# Patient Record
Sex: Female | Born: 1960 | Race: White | Hispanic: No | Marital: Single | Smoking: Never smoker
Health system: Southern US, Community
[De-identification: ages and names within clinical notes are randomized; demographics above are authoritative.]

## PROBLEM LIST (undated history)

## (undated) DIAGNOSIS — C719 Malignant neoplasm of brain, unspecified: Secondary | ICD-10-CM

## (undated) DIAGNOSIS — C50919 Malignant neoplasm of unspecified site of unspecified female breast: Secondary | ICD-10-CM

## (undated) DIAGNOSIS — R569 Unspecified convulsions: Secondary | ICD-10-CM

## (undated) HISTORY — PX: BREAST SURGERY: SHX581

## (undated) HISTORY — PX: BRAIN SURGERY: SHX531

---

## 2011-08-06 ENCOUNTER — Encounter: Payer: Self-pay | Admitting: Orthopedic Surgery

## 2013-05-27 ENCOUNTER — Ambulatory Visit: Payer: Self-pay

## 2013-05-27 LAB — URINALYSIS, COMPLETE

## 2018-10-22 ENCOUNTER — Encounter: Payer: Self-pay | Admitting: Emergency Medicine

## 2018-10-22 ENCOUNTER — Ambulatory Visit
Admission: EM | Admit: 2018-10-22 | Discharge: 2018-10-22 | Disposition: A | Discharge status: Home / Self Care | Payer: Medicare Other

## 2018-10-22 ENCOUNTER — Other Ambulatory Visit: Payer: Self-pay

## 2018-10-22 DIAGNOSIS — H1031 Unspecified acute conjunctivitis, right eye: Secondary | ICD-10-CM | POA: Diagnosis not present

## 2018-10-22 DIAGNOSIS — Z9221 Personal history of antineoplastic chemotherapy: Secondary | ICD-10-CM

## 2018-10-22 DIAGNOSIS — Z9189 Other specified personal risk factors, not elsewhere classified: Secondary | ICD-10-CM

## 2018-10-22 DIAGNOSIS — H66001 Acute suppurative otitis media without spontaneous rupture of ear drum, right ear: Secondary | ICD-10-CM

## 2018-10-22 DIAGNOSIS — B86 Scabies: Secondary | ICD-10-CM | POA: Diagnosis not present

## 2018-10-22 DIAGNOSIS — Z853 Personal history of malignant neoplasm of breast: Secondary | ICD-10-CM

## 2018-10-22 HISTORY — DX: Malignant neoplasm of unspecified site of unspecified female breast: C50.919

## 2018-10-22 HISTORY — DX: Unspecified convulsions: R56.9

## 2018-10-22 HISTORY — DX: Malignant neoplasm of brain, unspecified: C71.9

## 2018-10-22 MED ORDER — CEFDINIR 300 MG PO CAPS: 300.0000 mg | ORAL_CAPSULE | Freq: Two times a day (BID) | ORAL | 0 refills | Status: AC

## 2018-10-22 MED ORDER — PERMETHRIN 5 % EX CREA: TOPICAL_CREAM | CUTANEOUS | 0 refills | Status: AC

## 2018-10-22 MED ORDER — POLYMYXIN B-TRIMETHOPRIM 10000-0.1 UNIT/ML-% OP SOLN: 1.0000 [drp] | OPHTHALMIC | 0 refills | Status: AC

## 2018-10-22 NOTE — Discharge Instructions (Signed)
It was very nice seeing you today in clinic. Thank you for entrusting me with your care.   You have an ear infection and eye infection. I have sent in antibiotics for both. Make sure you are taking with food and staying well hydrated.   It appears as if your rash is related to scabies.  Please utilize the medications that we discussed.  Apply the Elimite cream. Leave in place for 8-14 hours, then wash it off. The amount of cream that I am giving you is for 1 treatment - USE IT ALL.  Your prescription for the cream has been called in to your specifed pharmacy.  Wash all clothing and linens in [place] water to avoid re-infection.  May use Benadryl as need for itching.   Make arrangements to follow up with your regular doctor in 1 week for re-evaluation. If your symptoms/condition worsens, please seek follow up care either here or in the ER. Please remember, our [facility] providers are "right here with you" when you need [place].   Again, it was my pleasure to take care of you today. Thank you for choosing our clinic. I hope that you start to feel better quickly.   Honor Loh, MSN, APRN, FNP-C, CEN Advanced Practice Provider [facility]

## 2018-10-22 NOTE — ED Triage Notes (Signed)
Patient in today c/o rash and right ear pain x 4 days. Patient is on chemo for breast cancer.

## 2018-10-23 NOTE — ED Provider Notes (Signed)
Sarah Oconnell, Sarah Oconnell   Name: Sarah Oconnell DOB: [birth year] MRN: [id number] CSN: [id number] PCP: System, Pcp Not In  Arrival date and time:  10/22/18 1413  Chief Complaint:  Rash, Otalgia, and Eye Drainage   NOTE: Prior to seeing the patient today, I have reviewed the triage nursing documentation and vital signs. Clinical staff has updated patient's PMH/PSHx, current medication list, and drug allergies/intolerances to ensure comprehensive history available to assist in medical decision making.   History:   HPI: Sarah Oconnell is a 58 y.o. female who presents today with complaints of rash, pain in her RIGHT ear, and drainage form her RIGHT eye. Symptoms have been persistently worsening over the course of the last 4 days. Patient wears a hearing aid in her RIGHT ear, which is causing her symptoms to be worse overall. Patient denies any fevers. She has excess tearing from her RIGHT ear and notes exudative crusting the the morning. Eye is "painful and irritated". She denies any changes to her vision. Patient has not experienced any other significant upper respiratory symptoms; no cough, shortness of breath, facial pain, for sore throat. She denies being in close contact with anyone known to be ill.  Patient also presents with a diffusely distributed rash to her entire body. Rash reported to be worse in flexural areas and waistline. She describes the rash as being extremely pruritis. Patient has been using topical diphenhydramine with little improvement in her symptoms. She denies any new foods, soaps, or cosmetics. She started a new anti-seizure medication 3-4 weeks ago. Patient has been staying with family members for the last week of so, which coincidentally, is about the same time that the rash declared.   Of note, patient is on monoclonal antibody therapy (trastuzumab) for treatment of her metastatic breast cancer, which she has taken for about the last 19 years per her report. She is followed by  oncology at [facility] by Dr. Erline Levine. Her last treatment was 6 weeks ago; normally receives infusions every 3 weeks. 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     Past Medical History:  Diagnosis Date   Brain cancer ([place])    Breast cancer ([place])    Seizure ([place])     Past Surgical History:  Procedure Laterality Date   BRAIN SURGERY     BREAST SURGERY      Family History  Problem Relation Age of Onset   Hypertension Mother    Heart attack Father 49   Hyperlipidemia Father    Hypertension Father     Social History   Tobacco Use   Smoking status: Never Smoker   Smokeless tobacco: Never Used  Substance Use Topics   Alcohol use: Never    Frequency: Never   Drug use: Never    There are no active problems to display for this patient.   Home Medications:    Current Meds  Medication Sig   Calcium Carbonate-Vit D-Min (CALCIUM 600+D PLUS MINERALS) 600-400 MG-UNIT TABS Take by mouth.   conjugated estrogens (PREMARIN) vaginal cream 1/4 applicator 1 time per week vaginally   Cranberry-Vitamin C-Probiotic (AZO CRANBERRY) 250-30 MG TABS Take by mouth.   enalapril (VASOTEC) 20 MG tablet Take by mouth.   ferrous sulfate 325 (65 FE) MG tablet Take by mouth.   furosemide (LASIX) 20 MG tablet Take by mouth.   gabapentin (NEURONTIN) 300 MG capsule Take by mouth.   methadone (DOLOPHINE) 10 MG tablet Take by  mouth.   metoprolol tartrate (LOPRESSOR) 25 MG tablet Take by mouth.   Multiple Vitamin (MULTIVITAMIN) capsule Take by mouth.   naloxone (NARCAN) 2 MG/2ML injection    oxycodone (ROXICODONE) 30 MG immediate release tablet Take by mouth.   penicillin v potassium (VEETID) 500 MG tablet Take by mouth.   senna-docusate (SENOKOT-S) 8.6-50 MG tablet Take by mouth.   tretinoin (RETIN-A) 0.025 % cream Pea sized amount to entire face nightly or every other night if  too drying.   UNABLE TO FIND Take by mouth.   vitamin B-12 (CYANOCOBALAMIN) 1000 MCG tablet Take by mouth.   zonisamide (ZONEGRAN) 100 MG capsule Take by mouth.    Allergies:   Ciprofloxacin, Ertapenem, Levetiracetam, Clindamycin, Sulfa antibiotics, and Pregabalin  Review of Systems (ROS): Review of Systems  Constitutional: Negative for fatigue and fever.  HENT: Positive for ear pain. Negative for congestion, ear discharge, postnasal drip, rhinorrhea, sinus pressure, sinus pain, sneezing and sore throat.   Eyes: Positive for pain, discharge and redness. Negative for photophobia and visual disturbance.  Respiratory: Negative for cough, chest tightness and shortness of breath.   Cardiovascular: Positive for leg swelling. Negative for chest pain and palpitations.  Gastrointestinal: Negative for abdominal pain, diarrhea, nausea and vomiting.  Musculoskeletal: Negative for arthralgias, back pain, myalgias and neck pain.  Skin: Positive for pallor and rash.  Allergic/Immunologic: Positive for immunocompromised state.  Neurological: Positive for weakness (generalized). Negative for dizziness, syncope and headaches.  Hematological: Negative for adenopathy.  Psychiatric/Behavioral: The patient is nervous/anxious.      Vital Signs: Today's Vitals   10/22/18 1451 10/22/18 1452 10/22/18 1543  BP: 133/74    Pulse: 67    Resp: 18    Temp: 98.1 F (36.7 C)    TempSrc: Oral    SpO2: 100%    Weight: 149 lb (67.6 kg)    Height: 5\' 4"  (1.626 m)    PainSc:  7  7     Physical Exam: Physical Exam  Constitutional: She is oriented to person, place, and time and well-developed, well-nourished, and in no distress.  Non-toxic appearance. She has a sickly appearance (chronically ill appearing). No distress.  HENT:  Head: Normocephalic and atraumatic.  Right Ear: There is tenderness. Tympanic membrane is erythematous and bulging. A middle ear effusion (suppurative) is present. Decreased hearing  (wears hearing aid) is noted.  Left Ear: Tympanic membrane normal.  Nose: Nose normal.  Mouth/Throat: Uvula is midline and oropharynx is clear and moist.  Eyes: Pupils are equal, round, and reactive to light. EOM are normal. Right eye exhibits discharge and exudate. Right eye exhibits no hordeolum. No foreign body present in the right eye. Right conjunctiva is injected.  Neck: Normal range of motion and full passive range of motion without pain. Neck supple.  Cardiovascular: Normal rate, regular rhythm, normal heart sounds and intact distal pulses. Exam reveals no gallop and no friction rub.  No murmur heard. Pulmonary/Chest: Effort normal. No respiratory distress. She has no decreased breath sounds. She has no wheezes. She has no rhonchi. She has no rales.  Abdominal: Soft. Normal appearance and bowel sounds are normal. She exhibits no distension. There is no hepatosplenomegaly. There is no abdominal tenderness. There is no CVA tenderness.  Musculoskeletal: Normal range of motion.        General: Edema (2 + BLE) present.  Lymphadenopathy:    She has no cervical adenopathy.  Neurological: She is alert and oriented to person, place, and time. She has normal sensation.  She displays weakness (generalized 2/2 of malignancy related debility; has breast cancer with assoicated brain and spinal metastasis). Gait (shuffling) abnormal.  Skin: Skin is warm and dry. Rash noted. Rash is maculopapular. She is not diaphoretic. There is pallor.  Diffusely distributed maculopapular rash to entire body. Areas of confluences noted in flexural areas and waistline. Skin eruption is erythematous and significantly pruritic. See attached medical photograph of anterior torso.   Psychiatric: Mood, memory, affect and judgment normal.  Nursing note and vitals reviewed.   Urgent Care Treatments / Results:   LABS: PLEASE NOTE: all labs that were ordered this encounter are listed, however only abnormal results are  displayed. Labs Reviewed - No data to display  EKG: -None  RADIOLOGY: No results found.  PROCEDURES: Procedures  MEDICATIONS RECEIVED THIS VISIT: Medications - No data to display  PERTINENT CLINICAL COURSE NOTES/UPDATES:   Initial Impression / Assessment and Plan / Urgent Care Course:  Pertinent labs & imaging results that were available during my care of the patient were personally reviewed by me and considered in my medical decision making (see lab/imaging section of note for values and interpretations).  Sarah Oconnell is a 58 y.o. female who presents to [facility] today with complaints of Rash, Otalgia, and Eye Drainage   Patient is well appearing overall in clinic today. She does not appear to be in any acute distress. Presenting symptoms (see HPI) and exam as documented above. She has several concerns today. Will address as follows:   Rash: Patient with diffusely distributed pruritic rash. Given appearance and distribution, there is concern for parasitic infection (scabies). Medical photograph obtained (see above) and reviewed with attending physician on duty Lacinda Axon, DO). HPI reviewed with physician. I discussed DDx considerations as being delayed drug rash 2/2 new seizure medication (started 3-4 weeks ago) versus scabies. Again, given appearance and areas of confluence in the waistline and flexural areas, physician agrees that skin eruption appears to be consistent with scabies infestation. Will treat with permethrin 5% topical treatment; educated on use of topical. I spent a good amount of time counseling patient and caregiver about need to wash all clothing and linens in hot water to prevent transmission to others and reinfestation of the patient.    RIGHT otalgia: Ear is painful with movement. TM erythematous and bulging. No otorrhea or fevers. Patient wears hearing aid, which causes her pain to worse. 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 She is allergic to ertapenem,  which has a 10% chance of cross sensitivity to PCN. Consulted oncology pharmacist for recommendation given the fact that she is on active treatment for her mBC.  The decision was made to presue treatment using 3rd generation cephalosporin. Prescription sent for Omnicef 300 mg BID x 7 days.    RIGHT eye drainage: Eye exam reveals injected conjunctiva, excessive tearing, and mild exudative crusting. She complains of pain. No visual changes. Will treat for bacterial conjunctivitis using a 5 day course of Polytim ophthalmic gtts. Patient encouraged to use cool compresses to help soothe her eye. She was advised to avoid touching/rubbing eye. Discussed that eye infection is contagious, thus handwashing is important.   Discussed follow up with her oncologist as already scheduled, or sooner if not improving. Additionally, I encouraged her to reach out to her oncology navigator to discuss current conditions and prescriptions in efforts to maintain a proper comprehensive approach to her care both here at [facility] and at [facility]. I have reviewed the follow up and strict return  precautions for any new or worsening symptoms. Patient is aware of symptoms that would be deemed urgent/emergent, and would thus require further evaluation either here or in the emergency department. At the time of discharge, she verbalized understanding and consent with the discharge plan as it was reviewed with her. All questions were fielded by provider and/or clinic staff prior to patient discharge.    Final Clinical Impressions / Urgent Care Diagnoses:   Final diagnoses:  Acute bacterial conjunctivitis of right eye  Scabies  Non-recurrent acute suppurative otitis media of right ear without spontaneous rupture of tympanic membrane  History of breast cancer in female  At high risk for infection due to chemotherapy    New Prescriptions:  [place] Controlled Substance Registry consulted? Not Applicable  Meds ordered this encounter    Medications   trimethoprim-polymyxin b (POLYTRIM) ophthalmic solution    Sig: Place 1 drop into the right eye every 4 (four) hours. X 5 days    Dispense:  10 mL    Refill:  0   permethrin (ELIMITE) 5 % cream    Sig: AAA.- leave in place for 8-14 hours, then wash it off.  This is for 1 treatment - USE IT ALL.    Dispense:  60 g    Refill:  0   cefdinir (OMNICEF) 300 MG capsule    Sig: Take 1 capsule (300 mg total) by mouth 2 (two) times daily for 7 days.    Dispense:  14 capsule    Refill:  0    Recommended Follow up Care:  Patient encouraged to follow up with the following provider within the specified time frame, or sooner as dictated by the severity of her symptoms. As always, she was instructed that for any urgent/emergent care needs, she should seek care either here or in the emergency department for more immediate evaluation.  Follow-up Information    Your oncologist.   Why: See as already scheduled, or sooner if not imptoving. Reach out to your navigator to make them aware of today's visit, diagnoses, and planned course of treatment of your acute conditions.        NOTE: This note was prepared using Lobbyist along with smaller Company secretary. Despite my best ability to proofread, there is the potential that transcriptional errors may still occur from this process, and are completely unintentional.     Karen Kitchens, NP 10/23/18 1306

## 2020-07-24 ENCOUNTER — Emergency Department: Payer: Medicare Other

## 2020-07-24 ENCOUNTER — Other Ambulatory Visit: Payer: Self-pay

## 2020-07-24 ENCOUNTER — Emergency Department
Admission: EM | Admit: 2020-07-24 | Discharge: 2020-07-24 | Disposition: A | Discharge status: Home / Self Care | Payer: Medicare Other | Attending: Emergency Medicine | Admitting: Emergency Medicine

## 2020-07-24 DIAGNOSIS — R0602 Shortness of breath: Secondary | ICD-10-CM | POA: Insufficient documentation

## 2020-07-24 DIAGNOSIS — W01198A Fall on same level from slipping, tripping and stumbling with subsequent striking against other object, initial encounter: Secondary | ICD-10-CM | POA: Insufficient documentation

## 2020-07-24 DIAGNOSIS — Z853 Personal history of malignant neoplasm of breast: Secondary | ICD-10-CM | POA: Diagnosis not present

## 2020-07-24 DIAGNOSIS — R062 Wheezing: Secondary | ICD-10-CM | POA: Diagnosis not present

## 2020-07-24 DIAGNOSIS — W19XXXA Unspecified fall, initial encounter: Secondary | ICD-10-CM

## 2020-07-24 DIAGNOSIS — I959 Hypotension, unspecified: Secondary | ICD-10-CM | POA: Diagnosis not present

## 2020-07-24 DIAGNOSIS — R42 Dizziness and giddiness: Secondary | ICD-10-CM | POA: Diagnosis present

## 2020-07-24 DIAGNOSIS — N3000 Acute cystitis without hematuria: Secondary | ICD-10-CM | POA: Insufficient documentation

## 2020-07-24 DIAGNOSIS — Y92009 Unspecified place in unspecified non-institutional (private) residence as the place of occurrence of the external cause: Secondary | ICD-10-CM | POA: Diagnosis not present

## 2020-07-24 DIAGNOSIS — Z85841 Personal history of malignant neoplasm of brain: Secondary | ICD-10-CM | POA: Diagnosis not present

## 2020-07-24 LAB — BASIC METABOLIC PANEL
Anion gap: 6 (ref 5–15)
BUN: 21 mg/dL — ABNORMAL HIGH (ref 6–20)
CO2: 31 mmol/L (ref 22–32)
Calcium: 8.4 mg/dL — ABNORMAL LOW (ref 8.9–10.3)
Chloride: 102 mmol/L (ref 98–111)
Creatinine, Ser: 0.81 mg/dL (ref 0.44–1.00)
GFR, Estimated: >60 mL/min (ref >60)
Glucose, Bld: 95 mg/dL (ref 70–99)
Potassium: 5.2 mmol/L — ABNORMAL HIGH (ref 3.5–5.1)
Sodium: 139 mmol/L (ref 135–145)

## 2020-07-24 LAB — URINALYSIS, COMPLETE (UACMP) WITH MICROSCOPIC
Bilirubin Urine: NEGATIVE
Glucose, UA: NEGATIVE mg/dL
Ketones, ur: NEGATIVE mg/dL
Nitrite: NEGATIVE
Protein, ur: 30 mg/dL — AB
Specific Gravity, Urine: 1.019 (ref 1.005–1.030)
WBC, UA: >50 WBC/hpf — ABNORMAL HIGH (ref 0–5)
pH: 6 (ref 5.0–8.0)

## 2020-07-24 LAB — PROTIME-INR
INR: 1.2 (ref 0.8–1.2)
Prothrombin Time: 15 seconds (ref 11.4–15.2)

## 2020-07-24 LAB — CBC WITH DIFFERENTIAL/PLATELET
Abs Immature Granulocytes: 0.04 10*3/uL (ref 0.00–0.07)
Basophils Absolute: 0 10*3/uL (ref 0.0–0.1)
Basophils Relative: 0 %
Eosinophils Absolute: 0.3 10*3/uL (ref 0.0–0.5)
Eosinophils Relative: 4 %
HCT: 32.7 % — ABNORMAL LOW (ref 36.0–46.0)
Hemoglobin: 10.2 g/dL — ABNORMAL LOW (ref 12.0–15.0)
Immature Granulocytes: 1 %
Lymphocytes Relative: 10 %
Lymphs Abs: 0.7 10*3/uL (ref 0.7–4.0)
MCH: 29.7 pg (ref 26.0–34.0)
MCHC: 31.2 g/dL (ref 30.0–36.0)
MCV: 95.3 fL (ref 80.0–100.0)
Monocytes Absolute: 0.7 10*3/uL (ref 0.1–1.0)
Monocytes Relative: 8 %
Neutro Abs: 6 10*3/uL (ref 1.7–7.7)
Neutrophils Relative %: 77 %
Platelets: 267 10*3/uL (ref 150–400)
RBC: 3.43 MIL/uL — ABNORMAL LOW (ref 3.87–5.11)
RDW: 15.2 % (ref 11.5–15.5)
WBC: 7.8 10*3/uL (ref 4.0–10.5)
nRBC: 0 % (ref 0.0–0.2)

## 2020-07-24 LAB — TROPONIN I (HIGH SENSITIVITY)
Troponin I (High Sensitivity): 4 ng/L (ref <18)
Troponin I (High Sensitivity): 4 ng/L (ref <18)

## 2020-07-24 MED ORDER — CEFDINIR 300 MG PO CAPS
300.0000 mg | ORAL_CAPSULE | Freq: Two times a day (BID) | ORAL | Status: DC
  Administered 2020-07-24: 300 mg via ORAL
  Filled 2020-07-24: qty 1

## 2020-07-24 MED ORDER — CEFDINIR 300 MG PO CAPS: 300.0000 mg | ORAL_CAPSULE | Freq: Two times a day (BID) | ORAL | 0 refills | Status: AC

## 2020-07-24 MED ORDER — SODIUM CHLORIDE 0.9 % IV BOLUS
500.0000 mL | Freq: Once | INTRAVENOUS | Status: AC
  Administered 2020-07-24: 500 mL via INTRAVENOUS
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
## 2020-07-24 NOTE — ED Provider Notes (Signed)
[facility] Emergency Department Provider Note  ____________________________________________   Event Date/Time   First MD Initiated Contact with Patient 07/24/20 1257     (approximate)  I have reviewed the triage vital signs and the nursing notes.   HISTORY  Chief Complaint Hypotension  HPI Sarah Oconnell is a 60 y.o. female with a history of metastatic breast cancer, brain cancer s/p craniotomy, and a current right humeral fracture, presents to the ED from home.  Patient presents to the ED via EMS, following a mechanical fall.  Patient apparently slipped and fell in her room, hitting her head. She denies syncope or LOC, or preceding dizziness.  Patient was staying with her parents who are both in their 72s, for follow-up with [facility].  Parents were unable to help patient up after the fall. She also notes episodic events of sudden SOB and wheezing over the last 2 days. She has a history of chronic pulmonary nodules.     Past Medical History:  Diagnosis Date   Brain cancer ([place])    Breast cancer ([place])    Seizure ([place])     There are no problems to display for this patient.   Past Surgical History:  Procedure Laterality Date   BRAIN SURGERY     BREAST SURGERY      Prior to Admission medications   Medication Sig Start Date End Date Taking? Authorizing Provider  cefdinir (OMNICEF) 300 MG capsule Take 1 capsule (300 mg total) by mouth 2 (two) times daily for 7 days. 07/24/20 07/31/20 Yes Patte Winkel, Dannielle Karvonen, PA-C  Calcium Carbonate-Vit D-Min (CALCIUM 600+D PLUS MINERALS) 600-400 MG-UNIT TABS Take by mouth. 10/26/08   [provider]  conjugated estrogens (PREMARIN) vaginal cream 1/4 applicator 1 time per week vaginally 09/14/13   [provider]  Cranberry-Vitamin C-Probiotic (AZO CRANBERRY) 250-30 MG TABS Take by mouth. 07/31/09   [provider]  enalapril (VASOTEC) 20 MG tablet Take by mouth. 04/06/18   [provider]  ferrous sulfate 325 (65 FE) MG tablet Take by mouth. 07/31/09   [provider]  furosemide (LASIX) 20 MG tablet Take by mouth. 03/04/12   [provider]  gabapentin (NEURONTIN) 300 MG capsule Take by mouth. 09/16/17 06/16/19  [provider]  methadone (DOLOPHINE) 10 MG tablet Take by mouth. 07/13/15   [provider]  metoprolol tartrate (LOPRESSOR) 25 MG tablet Take by mouth. 05/04/16   [provider]  Multiple Vitamin (MULTIVITAMIN) capsule Take by mouth. 10/26/08   [provider]  naloxone Karma Greaser) 2 MG/2ML injection  05/24/15   [provider]  oxycodone (ROXICODONE) 30 MG immediate release tablet Take by mouth. 05/24/16   [provider]  penicillin v potassium (VEETID) 500 MG tablet Take by mouth. 10/26/08   [provider]  permethrin (ELIMITE) 5 % cream AAA.- leave in place for 8-14 hours, then wash it off.  This is for 1 treatment - USE IT ALL. 10/22/18   Karen Kitchens, NP  senna-docusate (SENOKOT-S) 8.6-50 MG tablet Take by mouth. 07/12/05   [provider]  tretinoin (RETIN-A) 0.025 % cream Pea sized amount to entire face nightly or every other night if too drying. 09/26/14   [provider]  trimethoprim-polymyxin b (POLYTRIM) ophthalmic solution Place 1 drop into the right eye every 4 (four) hours. X 5 days 10/22/18   Karen Kitchens, NP  UNABLE TO FIND Take by mouth. 07/31/09   [provider]  vitamin B-12 (CYANOCOBALAMIN) 1000 MCG tablet Take by mouth.    [provider]  zonisamide (ZONEGRAN) 100 MG capsule Take by mouth. 08/20/18   [provider]    Allergies Ciprofloxacin, Ertapenem, Levetiracetam, Clindamycin, Sulfa antibiotics, and Pregabalin  Family History  Problem Relation Age of Onset   Hypertension Mother    Heart attack Father 34   Hyperlipidemia Father    Hypertension Father     Social History Social History   Tobacco Use   Smoking  status: Never   Smokeless tobacco: Never  Vaping Use   Vaping Use: Never used  Substance Use Topics   Alcohol use: Never   Drug use: Never    Review of Systems  Constitutional: No fever/chills Eyes: No visual changes. ENT: No sore throat. Cardiovascular: Denies chest pain. Respiratory: Reports shortness of breath. Gastrointestinal: No abdominal pain.  No nausea, no vomiting.  No diarrhea.  No constipation. Genitourinary: Negative for dysuria. Musculoskeletal: Negative for back pain. Skin: Negative for rash. Neurological: Negative for headaches, focal weakness or numbness. ____________________________________________   PHYSICAL EXAM:  VITAL SIGNS: ED Triage Vitals  Enc Vitals Group     BP 07/24/20 1250 113/87     Pulse Rate 07/24/20 1250 67     Resp 07/24/20 1250 20     Temp 07/24/20 1300 97.9 F (36.6 C)     Temp Source 07/24/20 1300 Oral     SpO2 07/24/20 1250 95 %     Weight 07/24/20 1246 129 lb 13.6 oz (58.9 kg)     Height 07/24/20 1246 5\' 6"  (1.676 m)     Head Circumference --      Peak Flow --      Pain Score 07/24/20 1243 5     Pain Loc --      Pain Edu? --      Excl. in [place]? --     Constitutional: Alert and oriented. Well appearing and in no acute distress. Eyes: Conjunctivae are normal. PERRL. EOMI. Head: Atraumatic. Mouth/Throat: Mucous membranes are moist.  Oropharynx non-erythematous. Neck: No stridor.   Cardiovascular: Normal rate, regular rhythm. Grossly normal heart sounds.  Good peripheral circulation. Respiratory: Normal respiratory effort.  No retractions. Lungs CTAB. Gastrointestinal: Soft and nontender. No distention. No abdominal bruits. No CVA tenderness. Musculoskeletal: No lower extremity tenderness nor edema.  No joint effusions. Neurologic:  Normal speech and language. No gross focal neurologic deficits are appreciated. No gait instability. Skin:  Skin is warm, dry and intact. No rash noted. BLE edema and chronic skin changes  noted Psychiatric: Mood and affect are normal. Speech and behavior are normal.  ____________________________________________   LABS (all labs ordered are listed, but only abnormal results are displayed)  Labs Reviewed  BASIC METABOLIC PANEL - Abnormal; Notable for the following components:      Result Value   Potassium 5.2 (*)    BUN 21 (*)    Calcium 8.4 (*)    All other components within normal limits  CBC WITH DIFFERENTIAL/PLATELET - Abnormal; Notable for the following components:   RBC 3.43 (*)    Hemoglobin 10.2 (*)    HCT 32.7 (*)    All other components within normal limits  URINALYSIS, COMPLETE (UACMP) WITH MICROSCOPIC - Abnormal; Notable for the following components:   Color, Urine YELLOW (*)    APPearance CLOUDY (*)    Hgb urine dipstick SMALL (*)    Protein, ur 30 (*)    Leukocytes,Ua LARGE (*)    WBC, UA >  50 (*)    Bacteria, UA RARE (*)    All other components within normal limits  URINE CULTURE  PROTIME-INR  TROPONIN I (HIGH SENSITIVITY)  TROPONIN I (HIGH SENSITIVITY)   ____________________________________________  EKG  NSR 68 bpm PR interval 191 ms QRS duration 79 ms No STEMI Normal axis ____________________________________________  RADIOLOGY I, Melvenia Needles, personally viewed and evaluated these images (plain radiographs) as part of my medical decision making, as well as reviewing the written report by the radiologist.  ED MD interpretation:  agree with report  Official radiology report(s): CT Head Wo Contrast  Result Date: 07/24/2020 CLINICAL DATA:  Head trauma.  Dizziness. EXAM: CT HEAD WITHOUT CONTRAST TECHNIQUE: Contiguous axial images were obtained from the base of the skull through the vertex without intravenous contrast. COMPARISON:  None. FINDINGS: Brain: No evidence of acute large vascular territory infarction, acute hemorrhage, extra-axial collection or mass lesion/mass effect. Mineralization of bilateral basal ganglia,  brainstem, and bilateral cerebellar hemispheres. Prior right craniotomy with subjacent encephalomalacia. Adjacent ex vacuo ventricular dilation of the atrium of the right lateral ventricle. No hydrocephalus. Vascular: No hyperdense vessel identified. Skull: Numerous scattered lytic lesions throughout the calvarium and skull base. Prior right parietal craniotomy. Sinuses/Orbits: Posterior left sphenoid sinus mucosal thickening. Visualized orbits are unremarkable. Other: Large right mastoid effusion. IMPRESSION: 1. No evidence of acute intracranial abnormality. An MRI with contrast could provide more sensitive evaluation for intracranial metastatic disease and acute ischemia if clinically indicated. 2. Numerous scattered lytic lesions throughout the calvarium and skull base, concerning for metastatic disease in this patient with cancer. 3. Prior right craniotomy with subjacent encephalomalacia. 4. Large right mastoid effusion. Electronically Signed   By: Margaretha Sheffield MD   On: 07/24/2020 14:07   CT Chest Wo Contrast  Result Date: 07/24/2020 CLINICAL DATA:  Hypotension, short of breath, abnormal chest x-ray, history of breast cancer EXAM: CT CHEST WITHOUT CONTRAST TECHNIQUE: Multidetector CT imaging of the chest was performed following the standard protocol without IV contrast. COMPARISON:  07/24/2020 FINDINGS: Cardiovascular: Unenhanced imaging of the heart and great vessels demonstrate no pericardial effusion. Normal caliber of the thoracic aorta. Evaluation of the vascular lumen is limited without IV contrast. There is a right chest wall port via internal jugular approach, tip within the superior vena cava. Mediastinum/Nodes: Multiple small lymph nodes are seen within the right axilla and along the right supraclavicular region, largest measuring 11 mm in short axis. No other pathologic adenopathy within the remainder of the mediastinum, hila, or left axilla. Thyroid, trachea, and esophagus are grossly  unremarkable. Lungs/Pleura: Bilateral calcified pleural plaques are identified in the lower lung zones. Biapical pleural and parenchymal scarring also noted. Tree in bud nodular opacities are seen within the right upper and right lower lobe, consistent with inflammation or infection. No pneumothorax. Central airways are patent. Upper Abdomen: No acute upper abdominal findings. Musculoskeletal: Postsurgical changes are seen from left mastectomy. Right breast prosthesis is identified, with intracapsular rupture of the prosthesis. There is a known subacute proximal right humeral fracture with significant displacement and mild callus formation. No other acute displaced fractures. There is sclerosis of the posterolateral aspect of the right sixth rib. While this could reflect a prior healed fracture, bony metastatic disease cannot be excluded in this patient with a history of breast cancer. No other bony lesions to suggest metastatic disease. Reconstructed images demonstrate no additional findings. IMPRESSION: 1. Tree in bud nodular airspace disease within the right upper and right lower lobe, consistent with inflammatory or  infectious etiology. 2. Bilateral calcified pleural plaques, with biapical pleural and parenchymal scarring. 3. Nonspecific borderline enlarged lymph nodes within the right axilla and right supraclavicular region. No pathologic adenopathy. 4. Subacute right humeral fracture with significant displacement and mild callus formation. 5. Nonspecific sclerosis of the right posterolateral sixth rib. While this could reflect a prior healed fracture, bony metastasis cannot be excluded in a patient with a history of breast cancer. Comparison with any prior imaging would be useful. Electronically Signed   By: Randa Ngo M.D.   On: 07/24/2020 17:15   DG Chest Port 1 View  Result Date: 07/24/2020 CLINICAL DATA:  Shortness of breath history of breast and cervical cancer EXAM: PORTABLE CHEST 1 VIEW  COMPARISON:  None. FINDINGS: Right-sided central venous port tip over the cavoatrial region. Pleural thickening and calcification at both lung bases. Mild diffuse slightly asymmetrical interstitial opacity right greater than left with slight nodularity in the right upper lobe. Normal cardiomediastinal silhouette. No pneumothorax. Scoliosis of the spine. Chronic appearing fracture deformity of the right humeral neck with widely separated fracture fragments. IMPRESSION: 1. Pleural thickening and calcification at the lung bases. Slightly asymmetrical right greater than left interstitial opacity with nodularity in the right upper lobe indeterminate for pulmonary infection or metastatic disease. Recommend chest CT for further evaluation. 2. Chronic appearing ununited fracture involving the proximal right humerus Electronically Signed   By: Donavan Foil M.D.   On: 07/24/2020 15:49    ____________________________________________   PROCEDURES  Procedure(s) performed (including Critical Care):  Procedures  NS 500 ml bolus ____________________________________________   INITIAL IMPRESSION / ASSESSMENT AND PLAN / ED COURSE  As part of my medical decision making, I reviewed the following data within the [facility] reviewed as above, Old chart reviewed, Radiograph reviewed as above, and Notes from prior ED visits    Differential diagnosis includes, but is not limited to, alcohol, illicit or prescription medications, or other toxic ingestion; intracranial pathology such as stroke or intracerebral hemorrhage; fever or infectious causes including sepsis; hypoxemia and/or hypercarbia; uremia; trauma; endocrine related disorders such as diabetes, hypoglycemia, and thyroid-related diseases; hypertensive encephalopathy; etc.  Patient with a history of metastatic breast cancer, presents to the ED following mechanical fall.  Patient was evaluated in the ED for complaints, and presented  initially hypotensive at 68/40.  She quickly responded after fluid bolus given via EMS.  She is remained stable in the ED during the course.  Labs were overall reassuring exception of a urinalysis which revealed ongoing UTI.  CT showed no intracranial process, and chest x-ray was also normal and reassuring.  Given her recent complaint of some fleeting shortness of breath, CT of the chest was ordered, it revealed some questionable right lobe tree-in-bud changes concerning for possible pneumonia versus chronic phonatory changes.  After discussion of the CT finding with the patient and her mother, they were inclined to further follow-up with the primary pulmonologist at [facility]  We discussed the option of treating the possible pneumonia empirically, treating the UTI out right.  The option for Omnicef was discussed, and patient is discharged home with a prescription.  She will continue to monitor any symptoms and return to the ED if necessary.  She will otherwise follow-up with her primary specialist at [facility] as scheduled.  Return precautions have again reviewed. ____________________________________________   FINAL CLINICAL IMPRESSION(S) / ED DIAGNOSES  Final diagnoses:  Acute cystitis without hematuria  Hypotension, unspecified hypotension type  Fall at home, initial  encounter     ED Discharge Orders          Ordered    cefdinir (OMNICEF) 300 MG capsule  2 times daily        07/24/20 1836             Note:  This document was prepared using Dragon voice recognition software and may include unintentional dictation errors.    Melvenia Needles, PA-C 07/24/20 1901    Naaman Plummer, MD 07/25/20 1630

## 2020-07-24 NOTE — Discharge Instructions (Addendum)
You are being treated for a UTI and has had treatment to help increase your low blood pressure.  Take the antibiotic as directed. You should follow with primary provider or specialist as discussed.  Return to the ED if needed.

## 2020-07-26 LAB — URINE CULTURE: Culture: >=100000 — AB

## 2022-02-22 IMAGING — CT CT CHEST W/O CM
2 of 3 series · 14 of 36 positions shown, 17 images · non-contrast
Comparison: 07/24/2020

CLINICAL DATA: Hypotension, short of breath, abnormal chest x-ray,
history of breast cancer

EXAM:
CT CHEST WITHOUT CONTRAST
TECHNIQUE: Multidetector CT imaging of the chest was performed following the
standard protocol without IV contrast.

[Series 2: thorax · axial · 0.59mm/px · z∈[-241,-27]mm · 11 of 127 slices shown, 14 images]
[im 10/127  mediastinal]
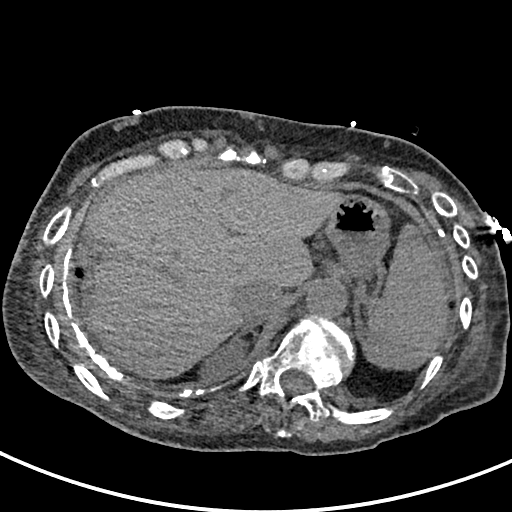
[im 10/127  lung]
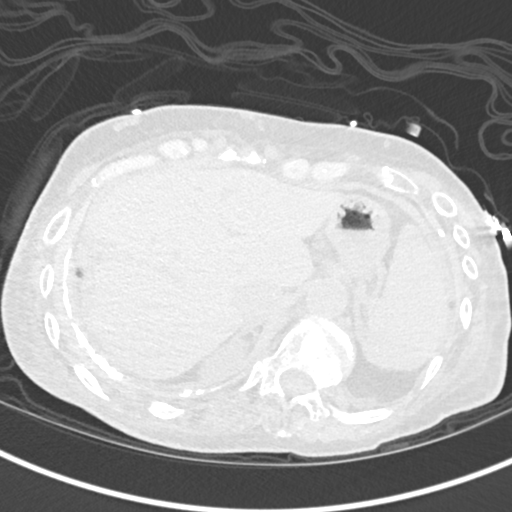
[im 19/127  lung]
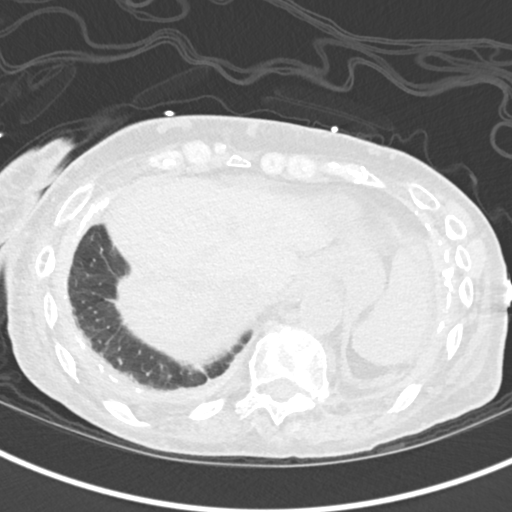
[im 29/127  lung]
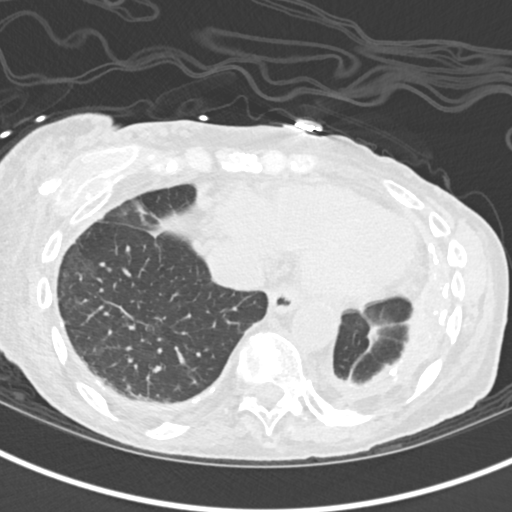
[im 43/127  lung]
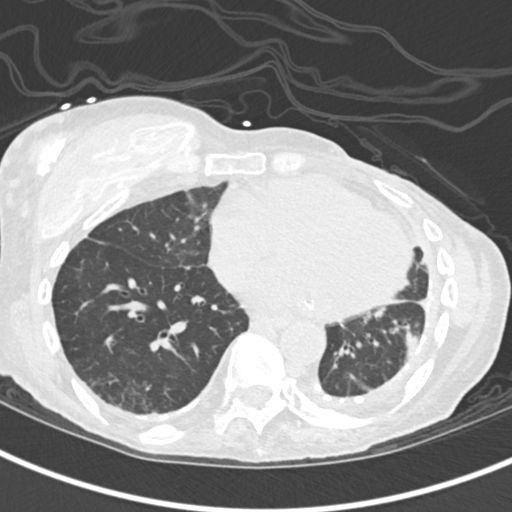
[im 52/127  mediastinal]
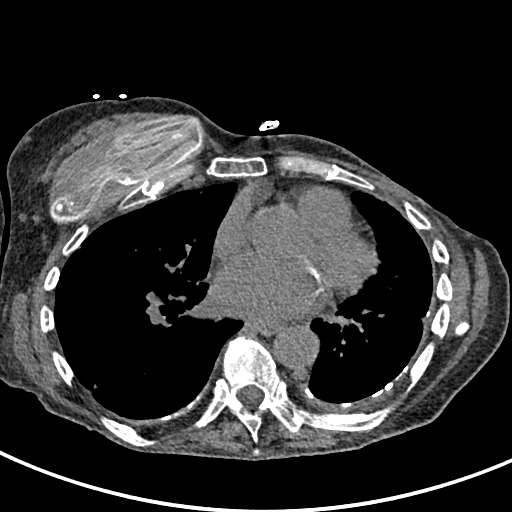
[im 52/127  lung]
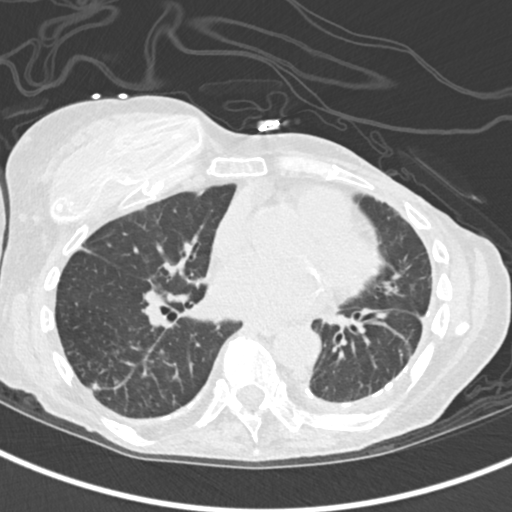
[im 66/127  lung]
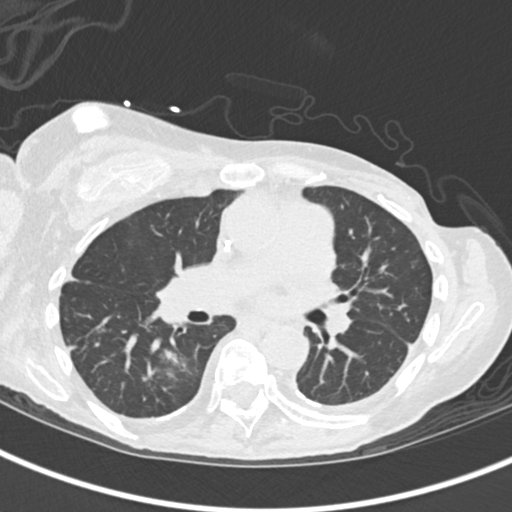
[im 75/127  lung]
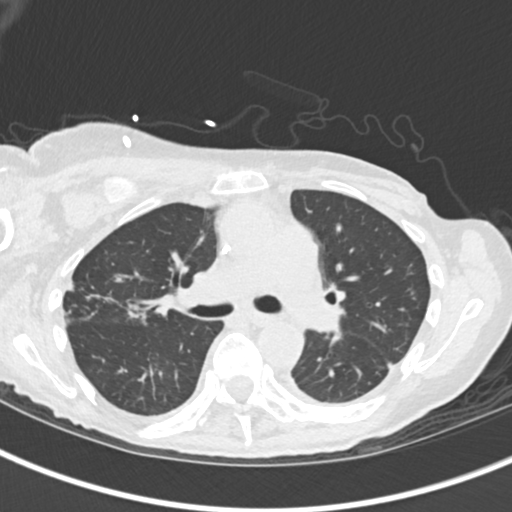
[im 85/127  lung]
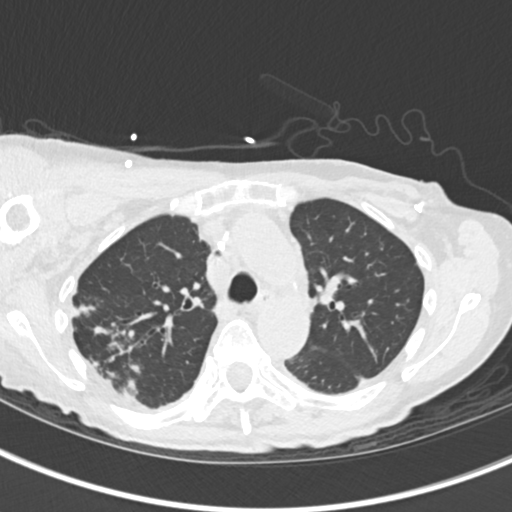
[im 99/127  mediastinal]
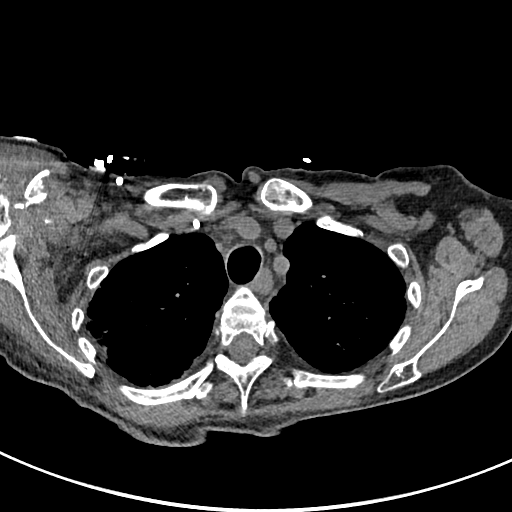
[im 99/127  lung]
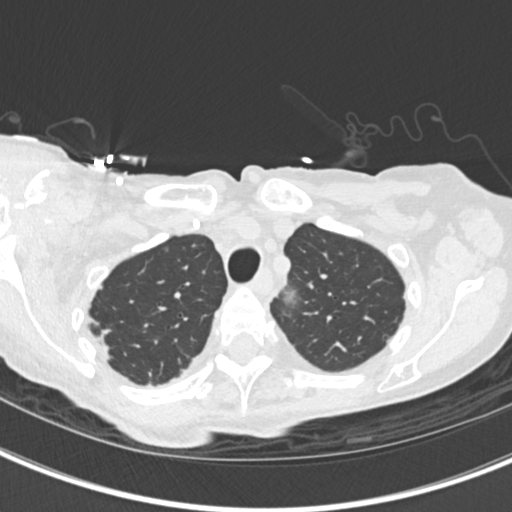
[im 108/127  lung]
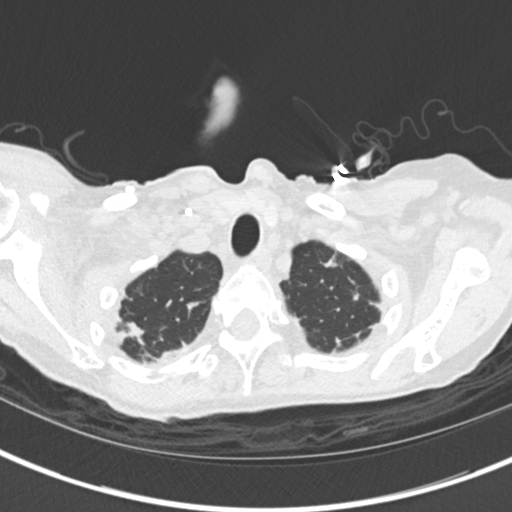
[im 117/127  lung]
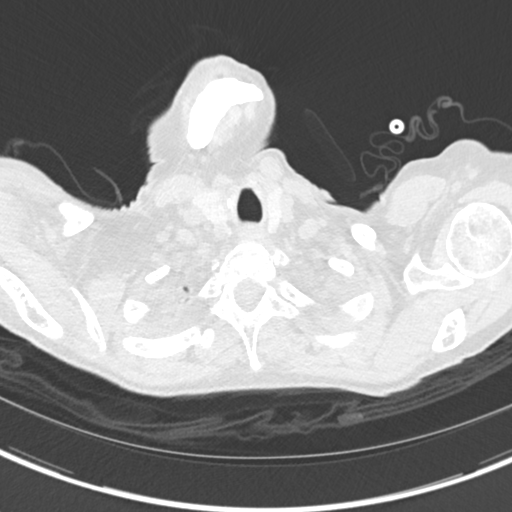

[Series 5: coronal · coronal · 0.52mm/px · 3 of 109 slices shown]
[im 22/109  lung]
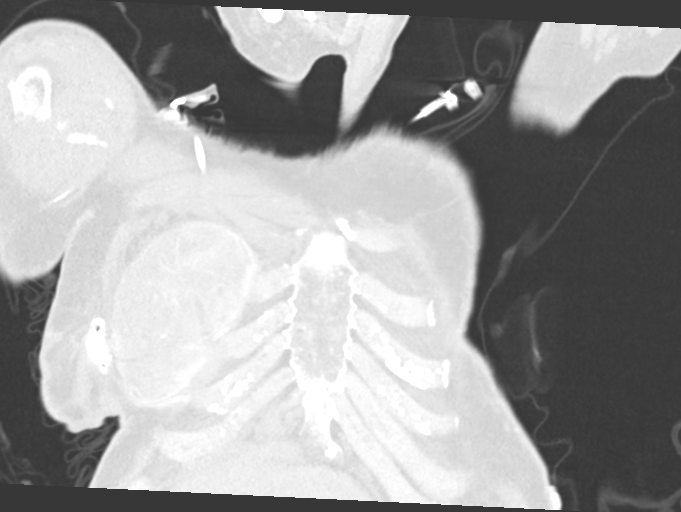
[im 44/109  lung]
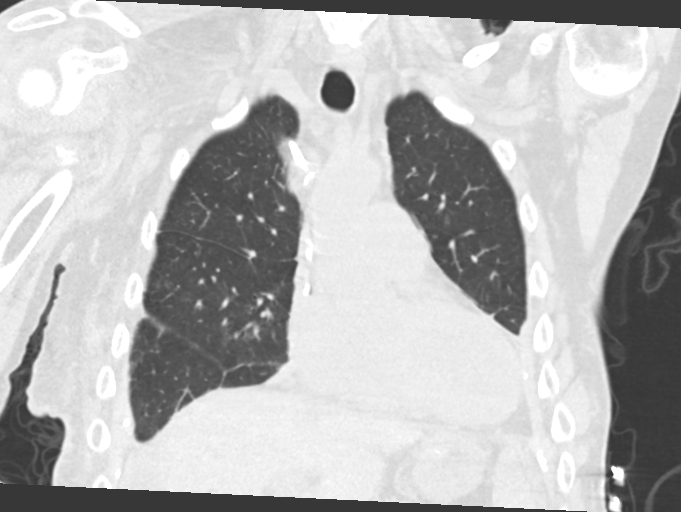
[im 65/109  lung]
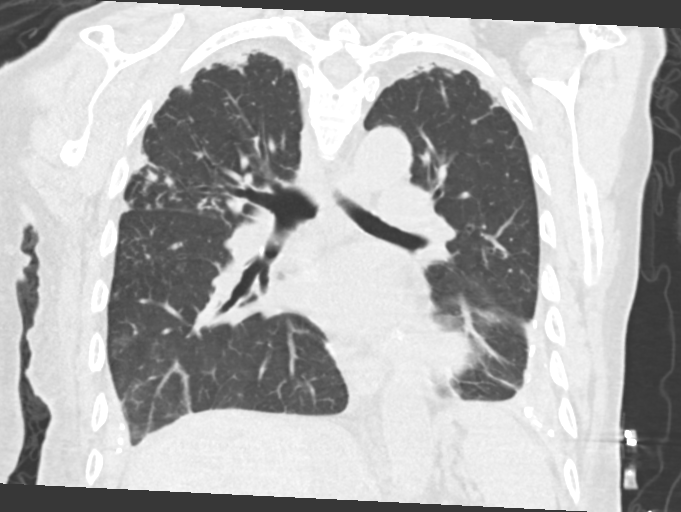

[14 of 36 positions shown; findings below may reference images not displayed]

FINDINGS: Cardiovascular: Unenhanced imaging of the heart and great vessels
demonstrate no pericardial effusion. Normal caliber of the thoracic
aorta. Evaluation of the vascular lumen is limited without IV
contrast. There is a right chest wall port via internal jugular
approach, tip within the superior vena cava.

Mediastinum/Nodes: Multiple small lymph nodes are seen within the
right axilla and along the right supraclavicular region, largest
measuring 11 mm in short axis. No other pathologic adenopathy within
the remainder of the mediastinum, hila, or left axilla.

Thyroid, trachea, and esophagus are grossly unremarkable.

Lungs/Pleura: Bilateral calcified pleural plaques are identified in
the lower lung zones. Biapical pleural and parenchymal scarring also
noted.

Tree in bud nodular opacities are seen within the right upper and
right lower lobe, consistent with inflammation or infection. No
pneumothorax. Central airways are patent.

Upper Abdomen: No acute upper abdominal findings.

Musculoskeletal: Postsurgical changes are seen from left mastectomy.
Right breast prosthesis is identified, with intracapsular rupture of
the prosthesis.

There is a known subacute proximal right humeral fracture with
significant displacement and mild callus formation. No other acute
displaced fractures.

There is sclerosis of the posterolateral aspect of the right sixth
rib. While this could reflect a prior healed fracture, bony
metastatic disease cannot be excluded in this patient with a history
of breast cancer. No other bony lesions to suggest metastatic
disease.

Reconstructed images demonstrate no additional findings.
IMPRESSION: 1. Tree in bud nodular airspace disease within the right upper and
right lower lobe, consistent with inflammatory or infectious
etiology.
2. Bilateral calcified pleural plaques, with biapical pleural and
parenchymal scarring.
3. Nonspecific borderline enlarged lymph nodes within the right
axilla and right supraclavicular region. No pathologic adenopathy.
4. Subacute right humeral fracture with significant displacement and
mild callus formation.
5. Nonspecific sclerosis of the right posterolateral sixth rib.
While this could reflect a prior healed fracture, bony metastasis
cannot be excluded in a patient with a history of breast cancer.
Comparison with any prior imaging would be useful.
